# Patient Record
Sex: Male | Born: 1990
Health system: Southern US, Community
[De-identification: ages and names within clinical notes are randomized; demographics above are authoritative.]

## PROBLEM LIST (undated history)

## (undated) DIAGNOSIS — M1990 Unspecified osteoarthritis, unspecified site: Secondary | ICD-10-CM

---
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
## 2015-10-10 MED FILL — CELECOXIB 200 MG CAPSULE: 200 | 30 days supply | Qty: 30 | Fill #0

## 2015-12-23 MED FILL — CELECOXIB 200 MG CAPSULE: 200 | 30 days supply | Qty: 30 | Fill #1

## 2016-01-17 ENCOUNTER — Emergency Department (HOSPITAL_COMMUNITY)
Admission: EM | Admit: 2016-01-17 | Discharge: 2016-01-18 | Disposition: A | Discharge status: Home / Self Care | Payer: 59 | Attending: Emergency Medicine | Admitting: Emergency Medicine

## 2016-01-17 ENCOUNTER — Emergency Department (HOSPITAL_COMMUNITY): Payer: 59

## 2016-01-17 DIAGNOSIS — R509 Fever, unspecified: Secondary | ICD-10-CM | POA: Diagnosis present

## 2016-01-17 DIAGNOSIS — J189 Pneumonia, unspecified organism: Secondary | ICD-10-CM

## 2016-01-17 LAB — URINALYSIS, ROUTINE W REFLEX MICROSCOPIC
Bilirubin Urine: NEGATIVE
GLUCOSE, UA: NEGATIVE mg/dL
Hgb urine dipstick: NEGATIVE
KETONES UR: NEGATIVE mg/dL
LEUKOCYTES UA: NEGATIVE
Nitrite: NEGATIVE
PH: 5.5 (ref 5.0–8.0)
Protein, ur: NEGATIVE mg/dL
SPECIFIC GRAVITY, URINE: 1.01 (ref 1.005–1.030)

## 2016-01-17 LAB — CBC WITH DIFFERENTIAL/PLATELET
BASOS ABS: 0 10*3/uL (ref 0.0–0.1)
Basophils Relative: 0 %
Eosinophils Absolute: 0 10*3/uL (ref 0.0–0.7)
Eosinophils Relative: 0 %
HEMATOCRIT: 46.6 % (ref 39.0–52.0)
HEMOGLOBIN: 16.4 g/dL (ref 13.0–17.0)
LYMPHS PCT: 16 %
Lymphs Abs: 2.1 10*3/uL (ref 0.7–4.0)
MCH: 31.5 pg (ref 26.0–34.0)
MCHC: 35.2 g/dL (ref 30.0–36.0)
MCV: 89.4 fL (ref 78.0–100.0)
MONO ABS: 1 10*3/uL (ref 0.1–1.0)
MONOS PCT: 8 %
NEUTROS ABS: 9.9 10*3/uL — AB (ref 1.7–7.7)
NEUTROS PCT: 76 %
Platelets: 244 10*3/uL (ref 150–400)
RBC: 5.21 MIL/uL (ref 4.22–5.81)
RDW: 12.2 % (ref 11.5–15.5)
WBC: 13 10*3/uL — ABNORMAL HIGH (ref 4.0–10.5)

## 2016-01-17 LAB — COMPREHENSIVE METABOLIC PANEL
ALBUMIN: 4.3 g/dL (ref 3.5–5.0)
ALK PHOS: 62 U/L (ref 38–126)
ALT: 44 U/L (ref 17–63)
AST: 46 U/L — AB (ref 15–41)
Anion gap: 11 (ref 5–15)
BILIRUBIN TOTAL: 0.6 mg/dL (ref 0.3–1.2)
BUN: 10 mg/dL (ref 6–20)
CALCIUM: 9.6 mg/dL (ref 8.9–10.3)
CO2: 22 mmol/L (ref 22–32)
Chloride: 102 mmol/L (ref 101–111)
Creatinine, Ser: 1 mg/dL (ref 0.61–1.24)
GFR calc Af Amer: >60 mL/min (ref >60)
GFR calc non Af Amer: >60 mL/min (ref >60)
GLUCOSE: 99 mg/dL (ref 65–99)
Potassium: 3.8 mmol/L (ref 3.5–5.1)
Sodium: 135 mmol/L (ref 135–145)
TOTAL PROTEIN: 7.7 g/dL (ref 6.5–8.1)

## 2016-01-17 LAB — I-STAT BETA HCG BLOOD, ED (MC, WL, AP ONLY): I-stat hCG, quantitative: <5 m[IU]/mL (ref <5)

## 2016-01-17 LAB — I-STAT CG4 LACTIC ACID, ED: Lactic Acid, Venous: 2.13 mmol/L (ref 0.5–1.9)

## 2016-01-17 LAB — RAPID STREP SCREEN (MED CTR MEBANE ONLY): Streptococcus, Group A Screen (Direct): NEGATIVE

## 2016-01-17 MED ORDER — AZITHROMYCIN 250 MG PO TABS: 250.0000 mg | ORAL_TABLET | Freq: Every day | ORAL | 0 refills | Status: DC

## 2016-01-17 MED ORDER — DEXTROSE 5 % IV SOLN
500.0000 mg | Freq: Once | INTRAVENOUS | Status: AC
  Administered 2016-01-17: 500 mg via INTRAVENOUS
  Filled 2016-01-17: qty 500

## 2016-01-17 MED ORDER — IBUPROFEN 600 MG PO TABS: 600.0000 mg | ORAL_TABLET | Freq: Four times a day (QID) | ORAL | 0 refills | Status: AC | PRN

## 2016-01-17 MED ORDER — OSELTAMIVIR PHOSPHATE 75 MG PO CAPS: 75.0000 mg | ORAL_CAPSULE | Freq: Two times a day (BID) | ORAL | 0 refills | Status: AC

## 2016-01-17 MED ORDER — DEXTROSE 5 % IV SOLN
1.0000 g | Freq: Once | INTRAVENOUS | Status: AC
  Administered 2016-01-17: 1 g via INTRAVENOUS
  Filled 2016-01-17: qty 10

## 2016-01-17 MED ORDER — SODIUM CHLORIDE 0.9 % IV BOLUS (SEPSIS)
2000.0000 mL | Freq: Once | INTRAVENOUS | Status: AC
  Administered 2016-01-17: 2000 mL via INTRAVENOUS

## 2016-01-17 MED ORDER — ACETAMINOPHEN 500 MG PO TABS
1000.0000 mg | ORAL_TABLET | Freq: Once | ORAL | Status: AC
  Administered 2016-01-17: 1000 mg via ORAL
  Filled 2016-01-17: qty 2

## 2016-01-17 MED ORDER — KETOROLAC TROMETHAMINE 30 MG/ML IJ SOLN
30.0000 mg | Freq: Once | INTRAMUSCULAR | Status: AC
  Administered 2016-01-17: 30 mg via INTRAVENOUS
  Filled 2016-01-17: qty 1

## 2016-01-17 NOTE — ED Provider Notes (Signed)
[facility]-EMERGENCY DEPT Provider Note   CSN: [id number] Arrival date & time: 01/17/16  2057     History   Chief Complaint Chief Complaint  Patient presents with  . Fever    HPI Marcus Skinner is a 25 y.o. male.  HPI Patient presents with one day of fever, shaking chills, myalgia, headache, nonproductive cough. Denies vomiting or diarrhea, urinary symptoms, neck pain or stiffness. No new rashes. Has mild low back pain. Denies focal weakness or numbness. No known sick contacts. No recent international travel. No known tick or insect bites. Patient did not receive flu shot this year. Has been taking TheraFlu at home No past medical history on file.  There are no active problems to display for this patient.   No past surgical history on file.     Home Medications    Prior to Admission medications   Medication Sig Start Date End Date Taking? Authorizing Provider  celecoxib (CELEBREX) 200 MG capsule Take 200 mg by mouth daily.   Yes Historical Provider, MD  Pseudoephedrine-APAP-DM (DAYQUIL PO) Take 1 capsule by mouth daily as needed.   Yes Historical Provider, MD  azithromycin (ZITHROMAX) 250 MG tablet Take 1 tablet (250 mg total) by mouth daily. 01/18/16   Loren Raceravid Avanna Sowder, MD  ibuprofen (ADVIL,MOTRIN) 600 MG tablet Take 1 tablet (600 mg total) by mouth every 6 (six) hours as needed for fever, headache or moderate pain. 01/17/16   Loren Raceravid Mark Hassey, MD  oseltamivir (TAMIFLU) 75 MG capsule Take 1 capsule (75 mg total) by mouth every 12 (twelve) hours. 01/17/16   Loren Raceravid Shavanna Furnari, MD    Family History No family history on file.  Social History Social History  Substance Use Topics  . Smoking status: Not on file  . Smokeless tobacco: Not on file  . Alcohol use Not on file     Allergies   Review of patient's allergies indicates no known allergies.   Review of Systems Review of Systems  Constitutional: Positive for chills and fever.  HENT: Negative for congestion, sinus  pressure and sore throat.   Eyes: Negative for visual disturbance.  Respiratory: Positive for cough. Negative for shortness of breath and wheezing.   Cardiovascular: Negative for chest pain, palpitations and leg swelling.  Gastrointestinal: Positive for abdominal pain. Negative for diarrhea, nausea and vomiting.  Genitourinary: Negative for dysuria, flank pain, frequency and hematuria.  Musculoskeletal: Positive for back pain and myalgias. Negative for arthralgias, joint swelling, neck pain and neck stiffness.  Skin: Negative for rash and wound.  Neurological: Positive for headaches. Negative for dizziness, syncope, weakness, light-headedness and numbness.  All other systems reviewed and are negative.    Physical Exam Updated Vital Signs BP 129/88   Pulse 102   Temp 98.9 F (37.2 C) (Oral)   Resp 22   Ht 5\' 7"  (1.702 m)   Wt 181 lb (82.1 kg)   SpO2 97%   BMI 28.35 kg/m   Physical Exam  Constitutional: He is oriented to person, place, and time. He appears well-developed and well-nourished. No distress.  HENT:  Head: Normocephalic and atraumatic.  Mouth/Throat: Oropharynx is clear and moist.  Bilateral tonsillar hypertrophy with erythema. No exudates present. No sinus tenderness with percussion.  Eyes: EOM are normal. Pupils are equal, round, and reactive to light.  Neck: Normal range of motion. Neck supple.  No meningismus  Cardiovascular: Regular rhythm.  Exam reveals no gallop and no friction rub.   No murmur heard. Tachycardia  Pulmonary/Chest: Effort normal. He has wheezes.  Few scattered wheezes and rhonchi.  Abdominal: Soft. Bowel sounds are normal. There is no tenderness. There is no rebound and no guarding.  Musculoskeletal: Normal range of motion. He exhibits no edema or tenderness.  No midline thoracic or lumbar tenderness. No CVA tenderness. No lower extremity swelling or asymmetry.  Lymphadenopathy:    He has no cervical adenopathy.  Neurological: He is alert  and oriented to person, place, and time.  Moves all extremities without deficit. Sensation is fully intact.  Skin: Skin is warm and dry. Capillary refill takes less than 2 seconds. No rash noted. No erythema.  Psychiatric: He has a normal mood and affect. His behavior is normal.  Nursing note and vitals reviewed.    ED Treatments / Results  Labs (all labs ordered are listed, but only abnormal results are displayed) Labs Reviewed  COMPREHENSIVE METABOLIC PANEL - Abnormal; Notable for the following:       Result Value   AST 46 (*)    All other components within normal limits  CBC WITH DIFFERENTIAL/PLATELET - Abnormal; Notable for the following:    WBC 13.0 (*)    Neutro Abs 9.9 (*)    All other components within normal limits  I-STAT CG4 LACTIC ACID, ED - Abnormal; Notable for the following:    Lactic Acid, Venous 2.13 (*)    All other components within normal limits  RAPID STREP SCREEN (NOT AT [facility])  CULTURE, BLOOD (ROUTINE X 2)  CULTURE, BLOOD (ROUTINE X 2)  URINE CULTURE  CULTURE, GROUP A STREP ([facility])  URINALYSIS, ROUTINE W REFLEX MICROSCOPIC (NOT AT [facility])  INFLUENZA PANEL BY PCR (TYPE A & B, H1N1)  I-STAT BETA HCG BLOOD, ED ([facility], [facility], [facility] ONLY)    EKG  EKG Interpretation None       Radiology Dg Chest 2 View  Result Date: 01/17/2016 CLINICAL DATA:  Fever, headache, nausea. EXAM: CHEST  2 VIEW COMPARISON:  None. FINDINGS: Cardiomediastinal silhouette is normal. Patchy retrocardiac airspace opacity. Mild bronchitic changes. No pleural effusion. Trachea projects midline and there is no pneumothorax. Soft tissue planes and included osseous structures are non-suspicious. IMPRESSION: Bronchitic changes and retrocardiac airspace opacity concerning for bronchopneumonia. Electronically Signed   By: Awilda Metro M.D.   On: 01/17/2016 22:03    Procedures Procedures (including critical care time)  Medications Ordered in ED Medications  cefTRIAXone (ROCEPHIN) 1 g in dextrose  5 % 50 mL IVPB (1 g Intravenous New Bag/Given 01/17/16 2248)  azithromycin (ZITHROMAX) 500 mg in dextrose 5 % 250 mL IVPB (500 mg Intravenous New Bag/Given 01/17/16 2249)  sodium chloride 0.9 % bolus 2,000 mL (2,000 mLs Intravenous New Bag/Given 01/17/16 2148)  ketorolac (TORADOL) 30 MG/ML injection 30 mg (30 mg Intravenous Given 01/17/16 2148)  acetaminophen (TYLENOL) tablet 1,000 mg (1,000 mg Oral Given 01/17/16 2145)     Initial Impression / Assessment and Plan / ED Course  I have reviewed the triage vital signs and the nursing notes.  Pertinent labs & imaging results that were available during my care of the patient were reviewed by me and considered in my medical decision making (see chart for details).  Clinical Course   Patient is very well-appearing. He is in no distress. Repeat vital signs and improvement of tachycardia and patient is now normotensive. Continues to be very well-appearing. Given rapid onset, concern for influenza as cause for the patient's pneumonia. Will add Tamiflu to treatment course. Advised to follow-up with the patient's primary physician and return precautions given.  Final  Clinical Impressions(s) / ED Diagnoses   Final diagnoses:  Community acquired pneumonia, unspecified laterality    New Prescriptions New Prescriptions   AZITHROMYCIN (ZITHROMAX) 250 MG TABLET    Take 1 tablet (250 mg total) by mouth daily.   IBUPROFEN (ADVIL,MOTRIN) 600 MG TABLET    Take 1 tablet (600 mg total) by mouth every 6 (six) hours as needed for fever, headache or moderate pain.   OSELTAMIVIR (TAMIFLU) 75 MG CAPSULE    Take 1 capsule (75 mg total) by mouth every 12 (twelve) hours.     Loren Racer, MD 01/17/16 [phone]

## 2016-01-17 NOTE — ED Triage Notes (Signed)
Patient arrives with fever of unknown origin. Early this morning fever began. Up to 104.5. Also endorses head ache, right lower back ache, mild nausea without vomiting. Denies cough, abdominal pain, urinary symptoms. States that he felt fine yesterday.

## 2016-01-18 LAB — INFLUENZA PANEL BY PCR (TYPE A & B)
H1N1FLUPCR: NOT DETECTED
INFLAPCR: NEGATIVE
Influenza B By PCR: NEGATIVE

## 2016-01-18 NOTE — ED Notes (Signed)
Pt verbalized understanding of d/c instructions and has no further questions. Pt stable and NAD. VSS.  

## 2016-01-19 LAB — URINE CULTURE

## 2016-01-20 LAB — CULTURE, GROUP A STREP (THRC)

## 2016-01-22 LAB — CULTURE, BLOOD (ROUTINE X 2)
CULTURE: NO GROWTH
Culture: NO GROWTH

## 2016-03-26 ENCOUNTER — Encounter (HOSPITAL_COMMUNITY): Payer: Self-pay

## 2016-03-26 ENCOUNTER — Emergency Department (HOSPITAL_COMMUNITY): Payer: 59

## 2016-03-26 DIAGNOSIS — R079 Chest pain, unspecified: Secondary | ICD-10-CM | POA: Diagnosis present

## 2016-03-26 DIAGNOSIS — J189 Pneumonia, unspecified organism: Secondary | ICD-10-CM | POA: Insufficient documentation

## 2016-03-26 DIAGNOSIS — Z79899 Other long term (current) drug therapy: Secondary | ICD-10-CM | POA: Diagnosis not present

## 2016-03-26 LAB — CBC
HEMATOCRIT: 41.2 % (ref 39.0–52.0)
Hemoglobin: 13.6 g/dL (ref 13.0–17.0)
MCH: 30 pg (ref 26.0–34.0)
MCHC: 33 g/dL (ref 30.0–36.0)
MCV: 90.7 fL (ref 78.0–100.0)
Platelets: 297 10*3/uL (ref 150–400)
RBC: 4.54 MIL/uL (ref 4.22–5.81)
RDW: 13.1 % (ref 11.5–15.5)
WBC: 16.9 10*3/uL — ABNORMAL HIGH (ref 4.0–10.5)

## 2016-03-26 LAB — I-STAT TROPONIN, ED: Troponin i, poc: 0.06 ng/mL (ref 0.00–0.08)

## 2016-03-26 NOTE — ED Triage Notes (Signed)
Pt complaining of fevers x 1 week. Pt states new onset sharp left sided chest pain ~30 mins ago. Pt denies any injury/trauma. Pt states dry cough.

## 2016-03-27 ENCOUNTER — Emergency Department (HOSPITAL_COMMUNITY): Payer: 59

## 2016-03-27 ENCOUNTER — Emergency Department (HOSPITAL_COMMUNITY)
Admission: EM | Admit: 2016-03-27 | Discharge: 2016-03-27 | Disposition: A | Discharge status: Home / Self Care | Payer: 59 | Attending: Emergency Medicine | Admitting: Emergency Medicine

## 2016-03-27 DIAGNOSIS — J189 Pneumonia, unspecified organism: Secondary | ICD-10-CM

## 2016-03-27 DIAGNOSIS — J181 Lobar pneumonia, unspecified organism: Secondary | ICD-10-CM

## 2016-03-27 LAB — BASIC METABOLIC PANEL
ANION GAP: 12 (ref 5–15)
BUN: 13 mg/dL (ref 6–20)
CHLORIDE: 102 mmol/L (ref 101–111)
CO2: 23 mmol/L (ref 22–32)
CREATININE: 0.85 mg/dL (ref 0.61–1.24)
Calcium: 9.6 mg/dL (ref 8.9–10.3)
GFR calc non Af Amer: >60 mL/min (ref >60)
Glucose, Bld: 102 mg/dL — ABNORMAL HIGH (ref 65–99)
POTASSIUM: 3.6 mmol/L (ref 3.5–5.1)
SODIUM: 137 mmol/L (ref 135–145)

## 2016-03-27 LAB — I-STAT TROPONIN, ED: Troponin i, poc: 0 ng/mL (ref 0.00–0.08)

## 2016-03-27 MED ORDER — AZITHROMYCIN 250 MG PO TABS
500.0000 mg | ORAL_TABLET | Freq: Once | ORAL | Status: AC
  Administered 2016-03-27: 500 mg via ORAL
  Filled 2016-03-27: qty 2

## 2016-03-27 MED ORDER — HYDROCOD POLST-CPM POLST ER 10-8 MG/5ML PO SUER: 5.0000 mL | Freq: Two times a day (BID) | ORAL | 0 refills | Status: AC | PRN

## 2016-03-27 MED ORDER — AZITHROMYCIN 250 MG PO TABS: ORAL_TABLET | ORAL | 0 refills | Status: DC

## 2016-03-27 MED ORDER — DEXTROSE 5 % IV SOLN
1.0000 g | Freq: Once | INTRAVENOUS | Status: AC
  Administered 2016-03-27: 1 g via INTRAVENOUS
  Filled 2016-03-27: qty 10

## 2016-03-27 MED ORDER — IOPAMIDOL (ISOVUE-370) INJECTION 76%
INTRAVENOUS | Status: AC
  Administered 2016-03-27: 100 mL
  Filled 2016-03-27: qty 100

## 2016-03-27 NOTE — ED Provider Notes (Signed)
[facility]EM[facility] DEPT Provider Note   CSN: [id number] Arrival date & time: 03/26/16  2251     History   Chief Complaint Chief Complaint  Patient presents with  . Chest Pain  . Fever    HPI Marcus Skinner is a 25 y.o. male.  Patient presents with left sided chest pain x 1 day described as sharp, radiating to back, associated with deep breath, movement or cough. He reports he has had daily fever to Tmax 102 for the past 10 days with cough. No nausea or vomiting. No significant congestion. He had pneumonia in October of this year with similar symptoms. He reports a history of arthritis, formerly on Humira - stopped 8 months ago - now on Celebrex only. Patient is a non-smoker.   The history is provided by the patient. No language interpreter was used.  Chest Pain   Associated symptoms include cough, a fever and shortness of breath. Pertinent negatives include no abdominal pain, no nausea and no vomiting.  Fever   Associated symptoms include chest pain and cough. Pertinent negatives include no vomiting and no congestion.    No past medical history on file.  There are no active problems to display for this patient.   No past surgical history on file.     Home Medications    Prior to Admission medications   Medication Sig Start Date End Date Taking? Authorizing Provider  azithromycin (ZITHROMAX) 250 MG tablet Take 1 tablet (250 mg total) by mouth daily. 01/18/16   Loren Racer, MD  celecoxib (CELEBREX) 200 MG capsule Take 200 mg by mouth daily.    Historical Provider, MD  ibuprofen (ADVIL,MOTRIN) 600 MG tablet Take 1 tablet (600 mg total) by mouth every 6 (six) hours as needed for fever, headache or moderate pain. 01/17/16   Loren Racer, MD  oseltamivir (TAMIFLU) 75 MG capsule Take 1 capsule (75 mg total) by mouth every 12 (twelve) hours. 01/17/16   Loren Racer, MD  Pseudoephedrine-APAP-DM (DAYQUIL PO) Take 1 capsule by mouth daily as needed.    Historical  Provider, MD    Family History No family history on file.  Social History Social History  Substance Use Topics  . Smoking status: Never Smoker  . Smokeless tobacco: Never Used  . Alcohol use No     Allergies   Patient has no known allergies.   Review of Systems Review of Systems  Constitutional: Positive for fever.  HENT: Negative.  Negative for congestion.   Respiratory: Positive for cough and shortness of breath.   Cardiovascular: Positive for chest pain.  Gastrointestinal: Negative.  Negative for abdominal pain, nausea and vomiting.  Musculoskeletal: Negative.  Negative for myalgias.  Skin: Negative.   Neurological: Negative.      Physical Exam Updated Vital Signs BP 125/85 (BP Location: Right Arm)   Pulse 82   Temp 98.1 F (36.7 C) (Oral)   Resp 18   SpO2 99%   Physical Exam  Constitutional: He is oriented to person, place, and time. He appears well-developed and well-nourished. No distress.  HENT:  Head: Normocephalic.  Neck: Normal range of motion. Neck supple.  Cardiovascular: Normal rate and regular rhythm.   No murmur heard. Pulmonary/Chest: Effort normal and breath sounds normal. He has no wheezes. He has no rales.  Abdominal: Soft. There is no tenderness.  Musculoskeletal: Normal range of motion.  Neurological: He is alert and oriented to person, place, and time.  Skin: Skin is warm and dry.     ED  Treatments / Results  Labs (all labs ordered are listed, but only abnormal results are displayed) Labs Reviewed  BASIC METABOLIC PANEL - Abnormal; Notable for the following:       Result Value   Glucose, Bld 102 (*)    All other components within normal limits  CBC - Abnormal; Notable for the following:    WBC 16.9 (*)    All other components within normal limits  I-STAT TROPOININ, ED   Results for orders placed or performed during the hospital encounter of 03/27/16  Basic metabolic panel  Result Value Ref Range   Sodium 137 135 - 145  mmol/L   Potassium 3.6 3.5 - 5.1 mmol/L   Chloride 102 101 - 111 mmol/L   CO2 23 22 - 32 mmol/L   Glucose, Bld 102 (H) 65 - 99 mg/dL   BUN 13 6 - 20 mg/dL   Creatinine, Ser 1.610.85 0.61 - 1.24 mg/dL   Calcium 9.6 8.9 - 09.610.3 mg/dL   GFR calc non Af Amer >60 >60 mL/min   GFR calc Af Amer >60 >60 mL/min   Anion gap 12 5 - 15  CBC  Result Value Ref Range   WBC 16.9 (H) 4.0 - 10.5 K/uL   RBC 4.54 4.22 - 5.81 MIL/uL   Hemoglobin 13.6 13.0 - 17.0 g/dL   HCT 04.541.2 40.939.0 - 81.152.0 %   MCV 90.7 78.0 - 100.0 fL   MCH 30.0 26.0 - 34.0 pg   MCHC 33.0 30.0 - 36.0 g/dL   RDW 91.413.1 78.211.5 - 95.615.5 %   Platelets 297 150 - 400 K/uL  I-stat troponin, ED  Result Value Ref Range   Troponin i, poc 0.06 0.00 - 0.08 ng/mL   Comment 3            EKG  EKG Interpretation  Date/Time:  Friday March 26 2016 22:56:21 EST Ventricular Rate:  105 PR Interval:  120 QRS Duration: 78 QT Interval:  314 QTC Calculation: 415 R Axis:   24 Text Interpretation:  Sinus tachycardia Possible Left atrial enlargement Borderline ECG No old tracing to compare Confirmed by FLOYD MD, DANIEL [phone]([id number]) on 03/27/2016 1:44:44 AM       Radiology Dg Chest 2 View  Result Date: 03/27/2016 CLINICAL DATA:  Acute onset of left-sided chest pain, fever and cough. Initial encounter. EXAM: CHEST  2 VIEW COMPARISON:  Chest radiograph performed 01/17/2016 FINDINGS: The lungs are well-aerated. New left midlung airspace opacification is compatible with pneumonia. There is no evidence of pleural effusion or pneumothorax. The heart is normal in size; the mediastinal contour is within normal limits. No acute osseous abnormalities are seen. IMPRESSION: New left midlung pneumonia noted. Electronically Signed   By: Roanna RaiderJeffery  Chang M.D.   On: 03/27/2016 00:29    Procedures Procedures (including critical care time)  Medications Ordered in ED Medications  cefTRIAXone (ROCEPHIN) 1 g in dextrose 5 % 50 mL IVPB (not administered)     Initial Impression  / Assessment and Plan / ED Course  I have reviewed the triage vital signs and the nursing notes.  Pertinent labs & imaging results that were available during my care of the patient were reviewed by me and considered in my medical decision making (see chart for details).  Clinical Course     Patient with symptoms of cough and fever for the past week. He denies significant SOB - some on exertion that does not affect activity. Similar symptoms with pneumonia in October of this year.  Chest x-ray shows a recurrent pneumonia. VSS, no hypoxia. He has leukocytosis but appears in NAD and non-toxic. Initial troponin 0.06, however repeat test 0.00. CT chest performed and verifies pneumonia without other concerning finding.   Discussed with Dr. Adela LankFloyd who feels he is stable for discharge home.   Final Clinical Impressions(s) / ED Diagnoses   Final diagnoses:  None   1. CAP  New Prescriptions New Prescriptions   No medications on file     Elpidio AnisShari Malyk Girouard, PA-C 03/30/16 2213    Melene Planan Floyd, DO 04/01/16 [phone]

## 2016-07-16 DIAGNOSIS — M456 Ankylosing spondylitis lumbar region: Secondary | ICD-10-CM | POA: Diagnosis not present

## 2016-07-16 DIAGNOSIS — M549 Dorsalgia, unspecified: Secondary | ICD-10-CM | POA: Diagnosis not present

## 2016-07-16 DIAGNOSIS — Z79899 Other long term (current) drug therapy: Secondary | ICD-10-CM | POA: Diagnosis not present

## 2016-07-16 DIAGNOSIS — M546 Pain in thoracic spine: Secondary | ICD-10-CM | POA: Diagnosis not present

## 2016-07-16 MED FILL — MELOXICAM 15 MG TABLET: 15 | 30 days supply | Qty: 30 | Fill #0

## 2016-07-27 ENCOUNTER — Other Ambulatory Visit: Payer: Self-pay | Admitting: Rheumatology

## 2016-07-27 DIAGNOSIS — M459 Ankylosing spondylitis of unspecified sites in spine: Secondary | ICD-10-CM

## 2016-07-28 DIAGNOSIS — M456 Ankylosing spondylitis lumbar region: Secondary | ICD-10-CM | POA: Diagnosis not present

## 2016-08-05 ENCOUNTER — Other Ambulatory Visit: Payer: 59

## 2016-10-15 DIAGNOSIS — M549 Dorsalgia, unspecified: Secondary | ICD-10-CM | POA: Diagnosis not present

## 2016-10-15 DIAGNOSIS — Z79899 Other long term (current) drug therapy: Secondary | ICD-10-CM | POA: Diagnosis not present

## 2016-10-15 DIAGNOSIS — M456 Ankylosing spondylitis lumbar region: Secondary | ICD-10-CM | POA: Diagnosis not present

## 2017-01-28 DIAGNOSIS — M459 Ankylosing spondylitis of unspecified sites in spine: Secondary | ICD-10-CM | POA: Diagnosis not present

## 2017-02-03 DIAGNOSIS — J020 Streptococcal pharyngitis: Secondary | ICD-10-CM | POA: Diagnosis not present

## 2017-05-17 DIAGNOSIS — Z79899 Other long term (current) drug therapy: Secondary | ICD-10-CM | POA: Diagnosis not present

## 2017-05-17 DIAGNOSIS — M456 Ankylosing spondylitis lumbar region: Secondary | ICD-10-CM | POA: Diagnosis not present

## 2017-05-17 DIAGNOSIS — M549 Dorsalgia, unspecified: Secondary | ICD-10-CM | POA: Diagnosis not present

## 2017-05-26 DIAGNOSIS — J029 Acute pharyngitis, unspecified: Secondary | ICD-10-CM | POA: Diagnosis not present

## 2017-05-26 DIAGNOSIS — R21 Rash and other nonspecific skin eruption: Secondary | ICD-10-CM | POA: Diagnosis not present

## 2017-06-02 DIAGNOSIS — R21 Rash and other nonspecific skin eruption: Secondary | ICD-10-CM | POA: Diagnosis not present

## 2017-06-30 DIAGNOSIS — L404 Guttate psoriasis: Secondary | ICD-10-CM | POA: Diagnosis not present

## 2017-07-28 DIAGNOSIS — L409 Psoriasis, unspecified: Secondary | ICD-10-CM | POA: Diagnosis not present

## 2017-09-14 DIAGNOSIS — J029 Acute pharyngitis, unspecified: Secondary | ICD-10-CM | POA: Diagnosis not present

## 2017-11-10 ENCOUNTER — Emergency Department (HOSPITAL_COMMUNITY): Payer: 59

## 2017-11-10 ENCOUNTER — Emergency Department (HOSPITAL_COMMUNITY)
Admission: EM | Admit: 2017-11-10 | Discharge: 2017-11-10 | Disposition: A | Discharge status: Home / Self Care | Payer: 59 | Attending: Emergency Medicine | Admitting: Emergency Medicine

## 2017-11-10 ENCOUNTER — Encounter (HOSPITAL_COMMUNITY): Payer: Self-pay | Admitting: Emergency Medicine

## 2017-11-10 DIAGNOSIS — R05 Cough: Secondary | ICD-10-CM | POA: Diagnosis not present

## 2017-11-10 DIAGNOSIS — R0789 Other chest pain: Secondary | ICD-10-CM | POA: Diagnosis not present

## 2017-11-10 DIAGNOSIS — R079 Chest pain, unspecified: Secondary | ICD-10-CM | POA: Diagnosis not present

## 2017-11-10 DIAGNOSIS — Z79899 Other long term (current) drug therapy: Secondary | ICD-10-CM | POA: Diagnosis not present

## 2017-11-10 DIAGNOSIS — J029 Acute pharyngitis, unspecified: Secondary | ICD-10-CM | POA: Diagnosis not present

## 2017-11-10 DIAGNOSIS — J189 Pneumonia, unspecified organism: Secondary | ICD-10-CM | POA: Diagnosis not present

## 2017-11-10 DIAGNOSIS — J020 Streptococcal pharyngitis: Secondary | ICD-10-CM | POA: Insufficient documentation

## 2017-11-10 DIAGNOSIS — R509 Fever, unspecified: Secondary | ICD-10-CM | POA: Diagnosis present

## 2017-11-10 HISTORY — DX: Unspecified osteoarthritis, unspecified site: M19.90

## 2017-11-10 LAB — CBC WITH DIFFERENTIAL/PLATELET
ABS IMMATURE GRANULOCYTES: 0.1 10*3/uL (ref 0.0–0.1)
BASOS PCT: 0 %
Basophils Absolute: 0.1 10*3/uL (ref 0.0–0.1)
Eosinophils Absolute: 0 10*3/uL (ref 0.0–0.7)
Eosinophils Relative: 0 %
HEMATOCRIT: 47.4 % (ref 39.0–52.0)
HEMOGLOBIN: 15.6 g/dL (ref 13.0–17.0)
Immature Granulocytes: 1 %
LYMPHS PCT: 10 %
Lymphs Abs: 1.6 10*3/uL (ref 0.7–4.0)
MCH: 30.4 pg (ref 26.0–34.0)
MCHC: 32.9 g/dL (ref 30.0–36.0)
MCV: 92.4 fL (ref 78.0–100.0)
MONO ABS: 0.9 10*3/uL (ref 0.1–1.0)
MONOS PCT: 6 %
NEUTROS ABS: 13 10*3/uL — AB (ref 1.7–7.7)
Neutrophils Relative %: 83 %
Platelets: 239 10*3/uL (ref 150–400)
RBC: 5.13 MIL/uL (ref 4.22–5.81)
RDW: 12.5 % (ref 11.5–15.5)
WBC: 15.7 10*3/uL — ABNORMAL HIGH (ref 4.0–10.5)

## 2017-11-10 LAB — COMPREHENSIVE METABOLIC PANEL
ALBUMIN: 3.9 g/dL (ref 3.5–5.0)
ALK PHOS: 56 U/L (ref 38–126)
ALT: 57 U/L — ABNORMAL HIGH (ref 0–44)
ANION GAP: 11 (ref 5–15)
AST: 34 U/L (ref 15–41)
BUN: 10 mg/dL (ref 6–20)
CALCIUM: 9.3 mg/dL (ref 8.9–10.3)
CO2: 21 mmol/L — AB (ref 22–32)
Chloride: 104 mmol/L (ref 98–111)
Creatinine, Ser: 1.09 mg/dL (ref 0.61–1.24)
GFR calc Af Amer: >60 mL/min (ref >60)
GFR calc non Af Amer: >60 mL/min (ref >60)
GLUCOSE: 107 mg/dL — AB (ref 70–99)
Potassium: 3.6 mmol/L (ref 3.5–5.1)
SODIUM: 136 mmol/L (ref 135–145)
Total Bilirubin: 0.8 mg/dL (ref 0.3–1.2)
Total Protein: 7.2 g/dL (ref 6.5–8.1)

## 2017-11-10 LAB — I-STAT CG4 LACTIC ACID, ED: Lactic Acid, Venous: 0.84 mmol/L (ref 0.5–1.9)

## 2017-11-10 LAB — GROUP A STREP BY PCR: Group A Strep by PCR: DETECTED — AB

## 2017-11-10 MED ORDER — AZITHROMYCIN 250 MG PO TABS: 250.0000 mg | ORAL_TABLET | Freq: Every day | ORAL | 0 refills | Status: AC

## 2017-11-10 MED ORDER — SODIUM CHLORIDE 0.9 % IV BOLUS
1000.0000 mL | Freq: Once | INTRAVENOUS | Status: AC
  Administered 2017-11-10: 1000 mL via INTRAVENOUS

## 2017-11-10 MED ORDER — AZITHROMYCIN 250 MG PO TABS
500.0000 mg | ORAL_TABLET | Freq: Once | ORAL | Status: AC
  Administered 2017-11-10: 500 mg via ORAL
  Filled 2017-11-10: qty 2

## 2017-11-10 MED ORDER — PENICILLIN G BENZATHINE 1200000 UNIT/2ML IM SUSP
1.2000 10*6.[IU] | Freq: Once | INTRAMUSCULAR | Status: AC
  Administered 2017-11-10: 1.2 10*6.[IU] via INTRAMUSCULAR
  Filled 2017-11-10: qty 2

## 2017-11-10 NOTE — ED Provider Notes (Signed)
[facility] Provider Note  CSN: [id number] Arrival date & time: 11/10/17  1345  History   Chief Complaint Chief Complaint  Patient presents with  . Fever    HPI Marcus Skinner is a 27 y.o. male with no significant medical history who presented to the ED for several upper respiratory symptoms x1 day. Endorses fever, sore throat, painful swallowing, cough and shortness of breath. Denies chest pain, palpitations, leg swelling, headache, neck pain, trismus or voice changes. Endorses recent sick contact with his child who had strep throat. Patient has tried nothing prior to coming to the ED.  Past Medical History:  Diagnosis Date  . Arthritis     There are no active problems to display for this patient.   History reviewed. No pertinent surgical history.      Home Medications    Prior to Admission medications   Medication Sig Start Date End Date Taking? Authorizing Provider  azithromycin (ZITHROMAX) 250 MG tablet Take 1 tablet (250 mg total) by mouth daily for 4 days. 11/10/17 11/14/17  Damany Eastman, Jerrel IvoryGabrielle I, PA-C  celecoxib (CELEBREX) 200 MG capsule Take 200 mg by mouth daily.    [provider]  chlorpheniramine-HYDROcodone (TUSSIONEX PENNKINETIC ER) 10-8 MG/5ML SUER Take 5 mLs by mouth every 12 (twelve) hours as needed for cough. 03/27/16   Elpidio AnisUpstill, Shari, PA-C  ibuprofen (ADVIL,MOTRIN) 200 MG tablet Take 200 mg by mouth every 6 (six) hours as needed for fever.    [provider]  ibuprofen (ADVIL,MOTRIN) 600 MG tablet Take 1 tablet (600 mg total) by mouth every 6 (six) hours as needed for fever, headache or moderate pain. Patient not taking: Reported on 03/27/2016 01/17/16   Loren RacerYelverton, David, MD  oseltamivir (TAMIFLU) 75 MG capsule Take 1 capsule (75 mg total) by mouth every 12 (twelve) hours. Patient not taking: Reported on 03/27/2016 01/17/16   Loren RacerYelverton, David, MD    Family History No family history on file.  Social  History Social History   Tobacco Use  . Smoking status: Never Smoker  . Smokeless tobacco: Never Used  Substance Use Topics  . Alcohol use: No  . Drug use: Not on file     Allergies   Patient has no known allergies.   Review of Systems Review of Systems  Constitutional: Positive for fatigue and fever. Negative for chills.  HENT: Positive for congestion, sore throat and trouble swallowing. Negative for drooling, ear pain, facial swelling, sinus pressure, sinus pain and voice change.   Eyes: Negative for pain and visual disturbance.  Respiratory: Positive for cough, chest tightness and shortness of breath.   Cardiovascular: Negative for chest pain, palpitations and leg swelling.  Gastrointestinal: Negative.   Genitourinary: Negative.   Musculoskeletal: Negative.   Skin: Negative.   Neurological: Negative for dizziness, light-headedness and headaches.     Physical Exam Updated Vital Signs BP 115/85   Pulse (!) 113   Temp 98.1 F (36.7 C) (Oral)   Resp 16   SpO2 98%   Physical Exam  Constitutional: He appears well-developed and well-nourished. He is cooperative.  Non-toxic appearance. He appears ill.  Patient laying in bed sleeping. He appears uncomfortable when awaken.  HENT:  Head: Normocephalic and atraumatic.  Right Ear: Tympanic membrane, external ear and ear canal normal.  Left Ear: Tympanic membrane, external ear and ear canal normal.  Nose: Nose normal.  Mouth/Throat: Uvula is midline and mucous membranes are normal. No trismus in the jaw. No uvula swelling. No tonsillar  abscesses. Tonsils are 3+ on the right. Tonsils are 3+ on the left. Tonsillar exudate.  Bilateral tonsillar edema with erythema. Exudate seen on left tonsil.  Eyes: Pupils are equal, round, and reactive to light. Conjunctivae, EOM and lids are normal.  Cardiovascular: Regular rhythm, normal heart sounds, intact distal pulses and normal pulses. Tachycardia present.  No murmur  heard. Pulmonary/Chest: Effort normal and breath sounds normal. No tachypnea. No respiratory distress.  Lymphadenopathy:    He has no cervical adenopathy.  Neurological: He is alert.  Skin: No rash noted. He is not diaphoretic. No pallor.  Nursing note and vitals reviewed.    ED Treatments / Results  Labs (all labs ordered are listed, but only abnormal results are displayed) Labs Reviewed  GROUP A STREP BY PCR - Abnormal; Notable for the following components:      Result Value   Group A Strep by PCR DETECTED (*)    All other components within normal limits  COMPREHENSIVE METABOLIC PANEL - Abnormal; Notable for the following components:   CO2 21 (*)    Glucose, Bld 107 (*)    ALT 57 (*)    All other components within normal limits  CBC WITH DIFFERENTIAL/PLATELET - Abnormal; Notable for the following components:   WBC 15.7 (*)    Neutro Abs 13.0 (*)    All other components within normal limits  URINALYSIS, ROUTINE W REFLEX MICROSCOPIC  I-STAT CG4 LACTIC ACID, ED    EKG None  Radiology Dg Chest 2 View  Result Date: 11/10/2017 CLINICAL DATA:  Chest pain with cough EXAM: CHEST - 2 VIEW COMPARISON:  None. FINDINGS: There is lingular airspace disease concerning for pneumonia. There is no pleural effusion or pneumothorax. The heart and mediastinal contours are unremarkable. The osseous structures are unremarkable. IMPRESSION: Lingular airspace disease concerning for pneumonia. Followup PA and lateral chest X-ray is recommended in 3-4 weeks following trial of antibiotic therapy to ensure resolution and exclude underlying malignancy. Electronically Signed   By: Elige KoHetal  Patel   On: 11/10/2017 14:54    Procedures Procedures (including critical care time)  Medications Ordered in ED Medications  sodium chloride 0.9 % bolus 1,000 mL (1,000 mLs Intravenous New Bag/Given 11/10/17 1810)  penicillin g benzathine (BICILLIN LA) 1200000 UNIT/2ML injection 1.2 Million Units (has no  administration in time range)  azithromycin (ZITHROMAX) tablet 500 mg (500 mg Oral Given 11/10/17 1743)     Initial Impression / Assessment and Plan / ED Course  Triage vital signs and the nursing notes have been reviewed.  Pertinent labs & imaging results that were available during care of the patient were reviewed and considered in medical decision making (see chart for details).  Patient presents afebrile, but reports subjective fevers and cough since yesterday. Symptoms and physical exam suggestive of PNA. On exam, patient is not in respiratory distress and has been maintaining oxygen saturations > 98%. Patient has no risk factors such as CF, EtOH use or recent hospitalizations that raises concerns for unusual PNA pathogens.  He also has erythematous and edematous tonsils which is concerning for strep pharyngitis given recent exposure. Fortunately, his respiratory state is normal and stable. There is no neck pain, trismus, stridor or other abnormal findings that raise concern for retropharyngeal abscess.   Clinical Course as of Nov 10 1820  Thu Nov 10, 2017  1734 Elevated WBC indicative of infection. Normal lactic acid which is useful to rule out sepsis. Remaining labs unremarkable. EKG shows sinus tachycardia. No ST elevations/depressions or signs  of acute ischemia/infarct. CXR significant for consolidation/airspace disease seen in the lingular space. Useful in confirming PNA diagnosis. Patient remains hemodynamically and respiratorily stable to receive PO antibiotics as outpatient.   [GM]  1754 Tachycardic upon arrival. Will administer 1L NS bolus.   [GM]  1811 Strep +. Will treat with PCN G 1.10M units x1 today.   [GM]    Clinical Course User Index [GM] Marcus Skinner, Sharyon Medicus, PA-C   Final Clinical Impressions(s) / ED Diagnoses  1. Community Acquired Pneumonia. Zithromax 500mg  x1 given today. Prescribed Zithromax 250mg  QD x4 days for treatment. Advised to follow-up with PCP after  antibiotics is completed if he continues to have symptoms. Education provided on OTC and supportive treatment for symptom relief. 2. Strep Pharyngitis. Treated with IM Penicillin G 1.10M U  x1 today. Education provided on sick exposures and OTC/supportive treatment for symptom relief.  Dispo: Home. After thorough clinical evaluation, this patient is determined to be medically stable and can be safely discharged with the previously mentioned treatment and/or outpatient follow-up/referral(s). At this time, there are no other apparent medical conditions that require further screening, evaluation or treatment.  Final diagnoses:  Community acquired pneumonia, unspecified laterality  Strep pharyngitis    ED Discharge Orders         Ordered    azithromycin (ZITHROMAX) 250 MG tablet  Daily     11/10/17 [address] 11/10/17 Merri Brunette, MD 11/10/17 2242

## 2017-11-10 NOTE — Discharge Instructions (Addendum)
I'm sorry you had to come here on your child's birthday! You have strep throat and pneumonia.  You have received an injection of penicillin today to treat the strep throat. You do not need any more antibiotics for this.  You also received your 1st dose of antibiotics (Zithromax) for pneumonia. Starting tomorrow, you will continue taking Azithromycin (Zithromax) for 4 days. Be sure to take the full course of this medication to ensure your medication is treated appropriately.  For fever, you may continue take Tylenol and/or Ibuprofen. Be sure to drink plenty of water.  I hope you begin to feel better soon!

## 2017-11-10 NOTE — ED Triage Notes (Signed)
Patient complains of chest pain with cough and fever since yesterday. Patient took tylenol prior to coming to ED. Patient alert, oriented, and in no apparent distress at this time.

## 2017-11-14 DIAGNOSIS — Z79899 Other long term (current) drug therapy: Secondary | ICD-10-CM | POA: Diagnosis not present

## 2017-11-14 DIAGNOSIS — M456 Ankylosing spondylitis lumbar region: Secondary | ICD-10-CM | POA: Diagnosis not present

## 2017-11-14 DIAGNOSIS — J020 Streptococcal pharyngitis: Secondary | ICD-10-CM | POA: Diagnosis not present

## 2017-11-17 DIAGNOSIS — J0301 Acute recurrent streptococcal tonsillitis: Secondary | ICD-10-CM | POA: Diagnosis not present

## 2017-11-17 DIAGNOSIS — J189 Pneumonia, unspecified organism: Secondary | ICD-10-CM | POA: Diagnosis not present

## 2018-01-08 DIAGNOSIS — B950 Streptococcus, group A, as the cause of diseases classified elsewhere: Secondary | ICD-10-CM | POA: Diagnosis not present

## 2018-01-08 DIAGNOSIS — J029 Acute pharyngitis, unspecified: Secondary | ICD-10-CM | POA: Diagnosis not present

## 2018-01-24 DIAGNOSIS — J029 Acute pharyngitis, unspecified: Secondary | ICD-10-CM | POA: Diagnosis not present

## 2018-01-24 DIAGNOSIS — B950 Streptococcus, group A, as the cause of diseases classified elsewhere: Secondary | ICD-10-CM | POA: Diagnosis not present

## 2018-01-24 DIAGNOSIS — Z7689 Persons encountering health services in other specified circumstances: Secondary | ICD-10-CM | POA: Diagnosis not present

## 2018-01-24 DIAGNOSIS — R509 Fever, unspecified: Secondary | ICD-10-CM | POA: Diagnosis not present

## 2018-03-04 IMAGING — CR DG CHEST 2V
2 series · 2 of 2 positions shown · non-contrast
Comparison: Chest radiograph performed 01/17/2016

CLINICAL DATA: Acute onset of left-sided chest pain, fever and
cough. Initial encounter.

EXAM:
CHEST  2 VIEW

[chest pa]
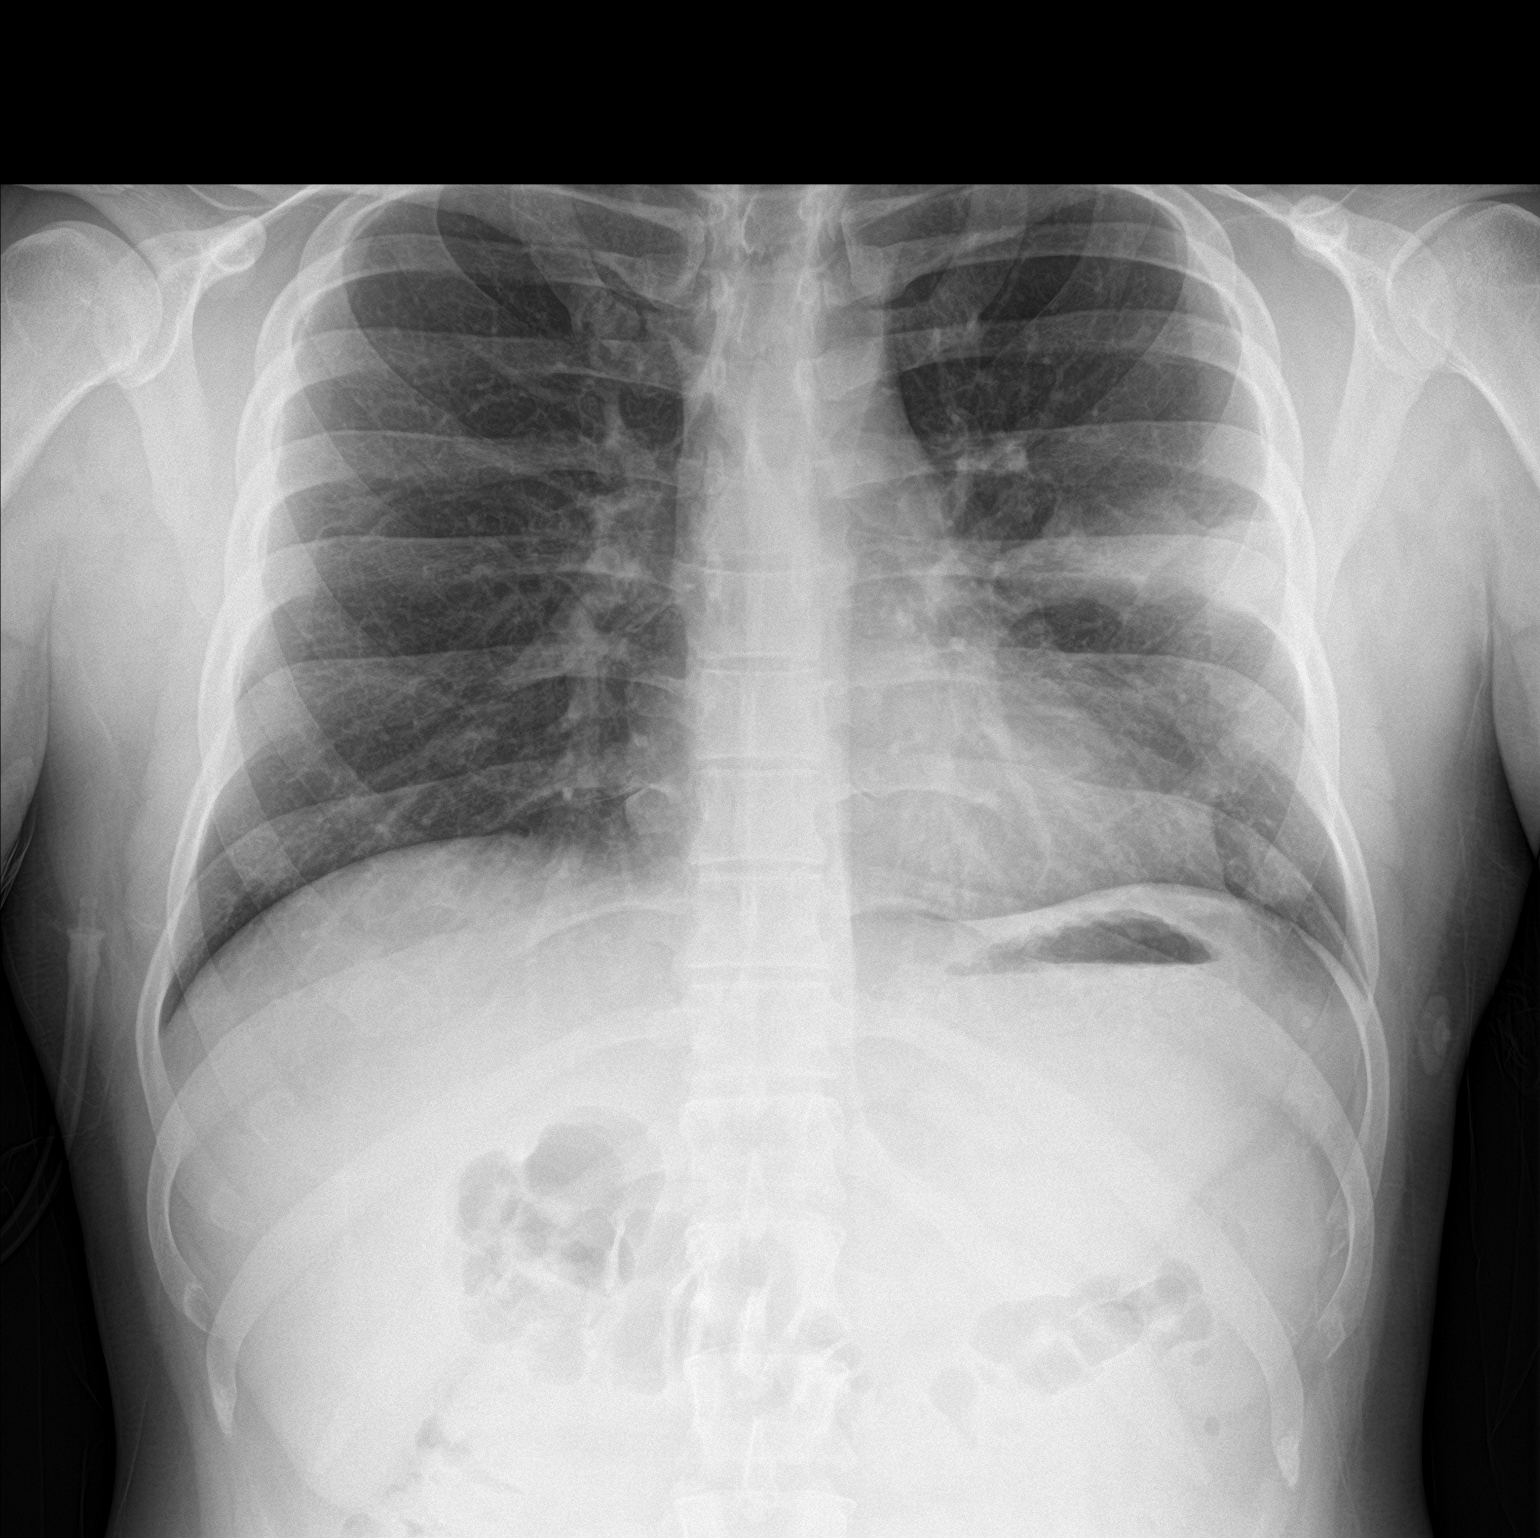

[chest lat]
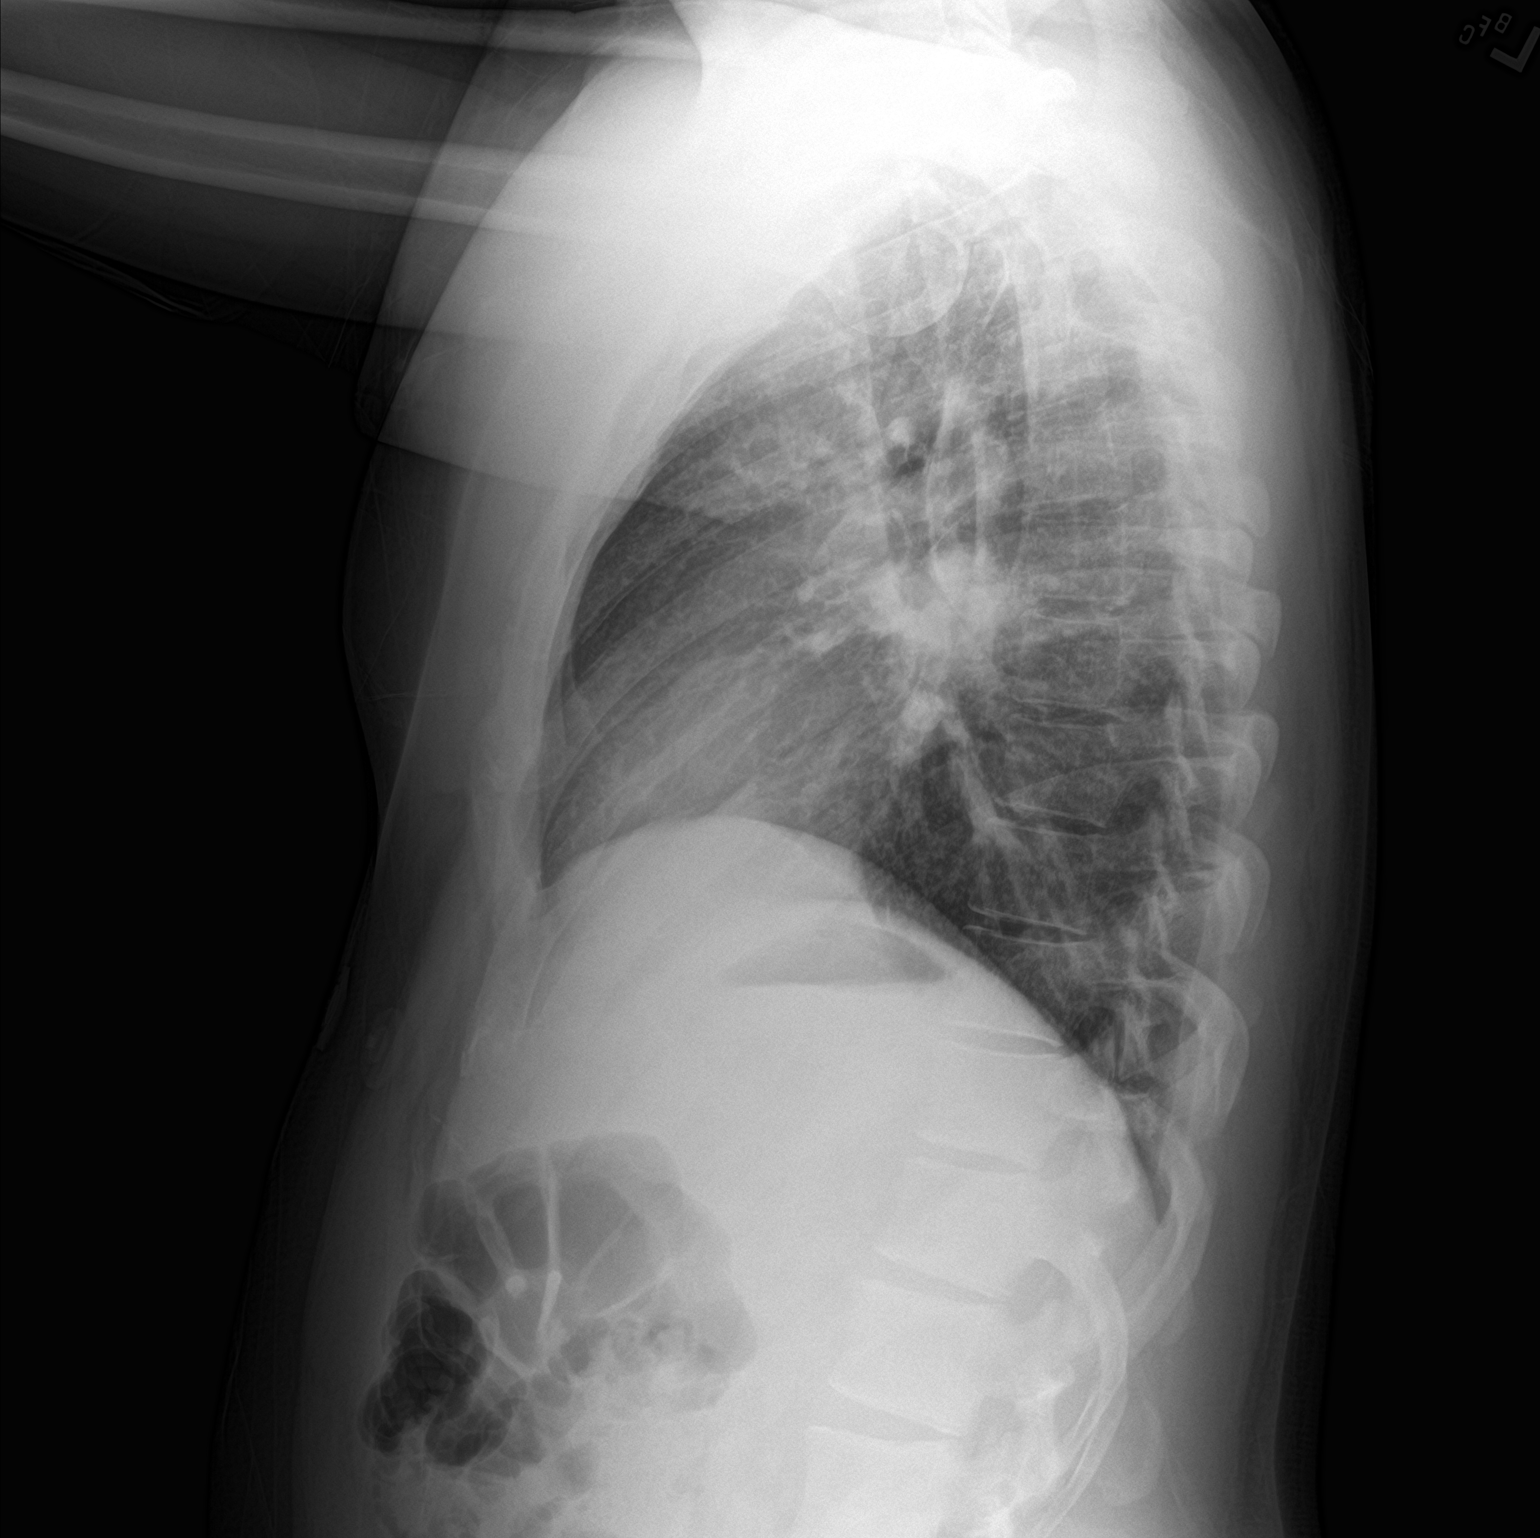

[2 of 2 positions shown; findings below may reference images not displayed]

FINDINGS: The lungs are well-aerated. New left midlung airspace opacification
is compatible with pneumonia. There is no evidence of pleural
effusion or pneumothorax.

The heart is normal in size; the mediastinal contour is within
normal limits. No acute osseous abnormalities are seen.
IMPRESSION: New left midlung pneumonia noted.

## 2019-10-19 IMAGING — DX DG CHEST 2V
2 series · 2 of 2 positions shown · non-contrast
Comparison: None.

CLINICAL DATA: Chest pain with cough

EXAM:
CHEST - 2 VIEW

[chest pa]
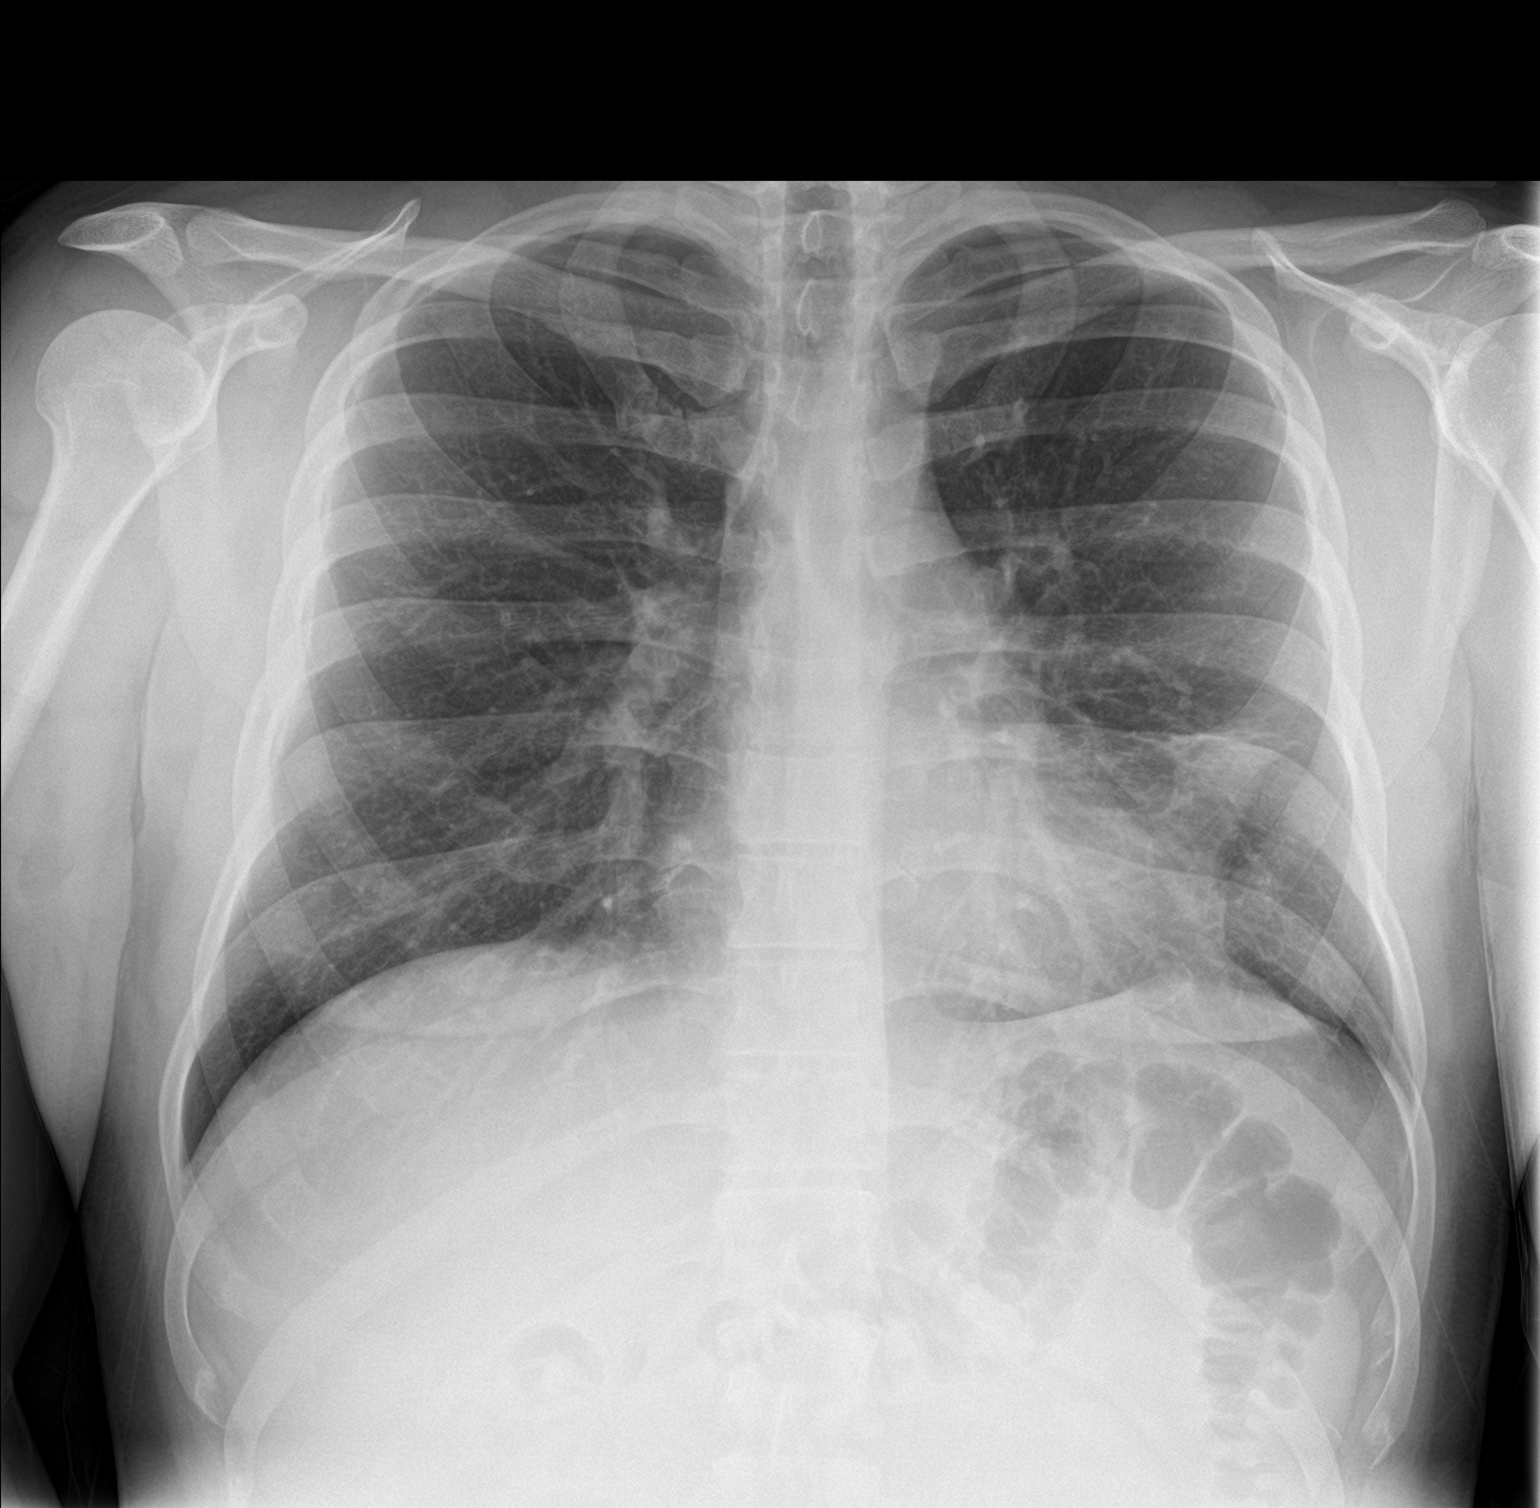

[chest lat]
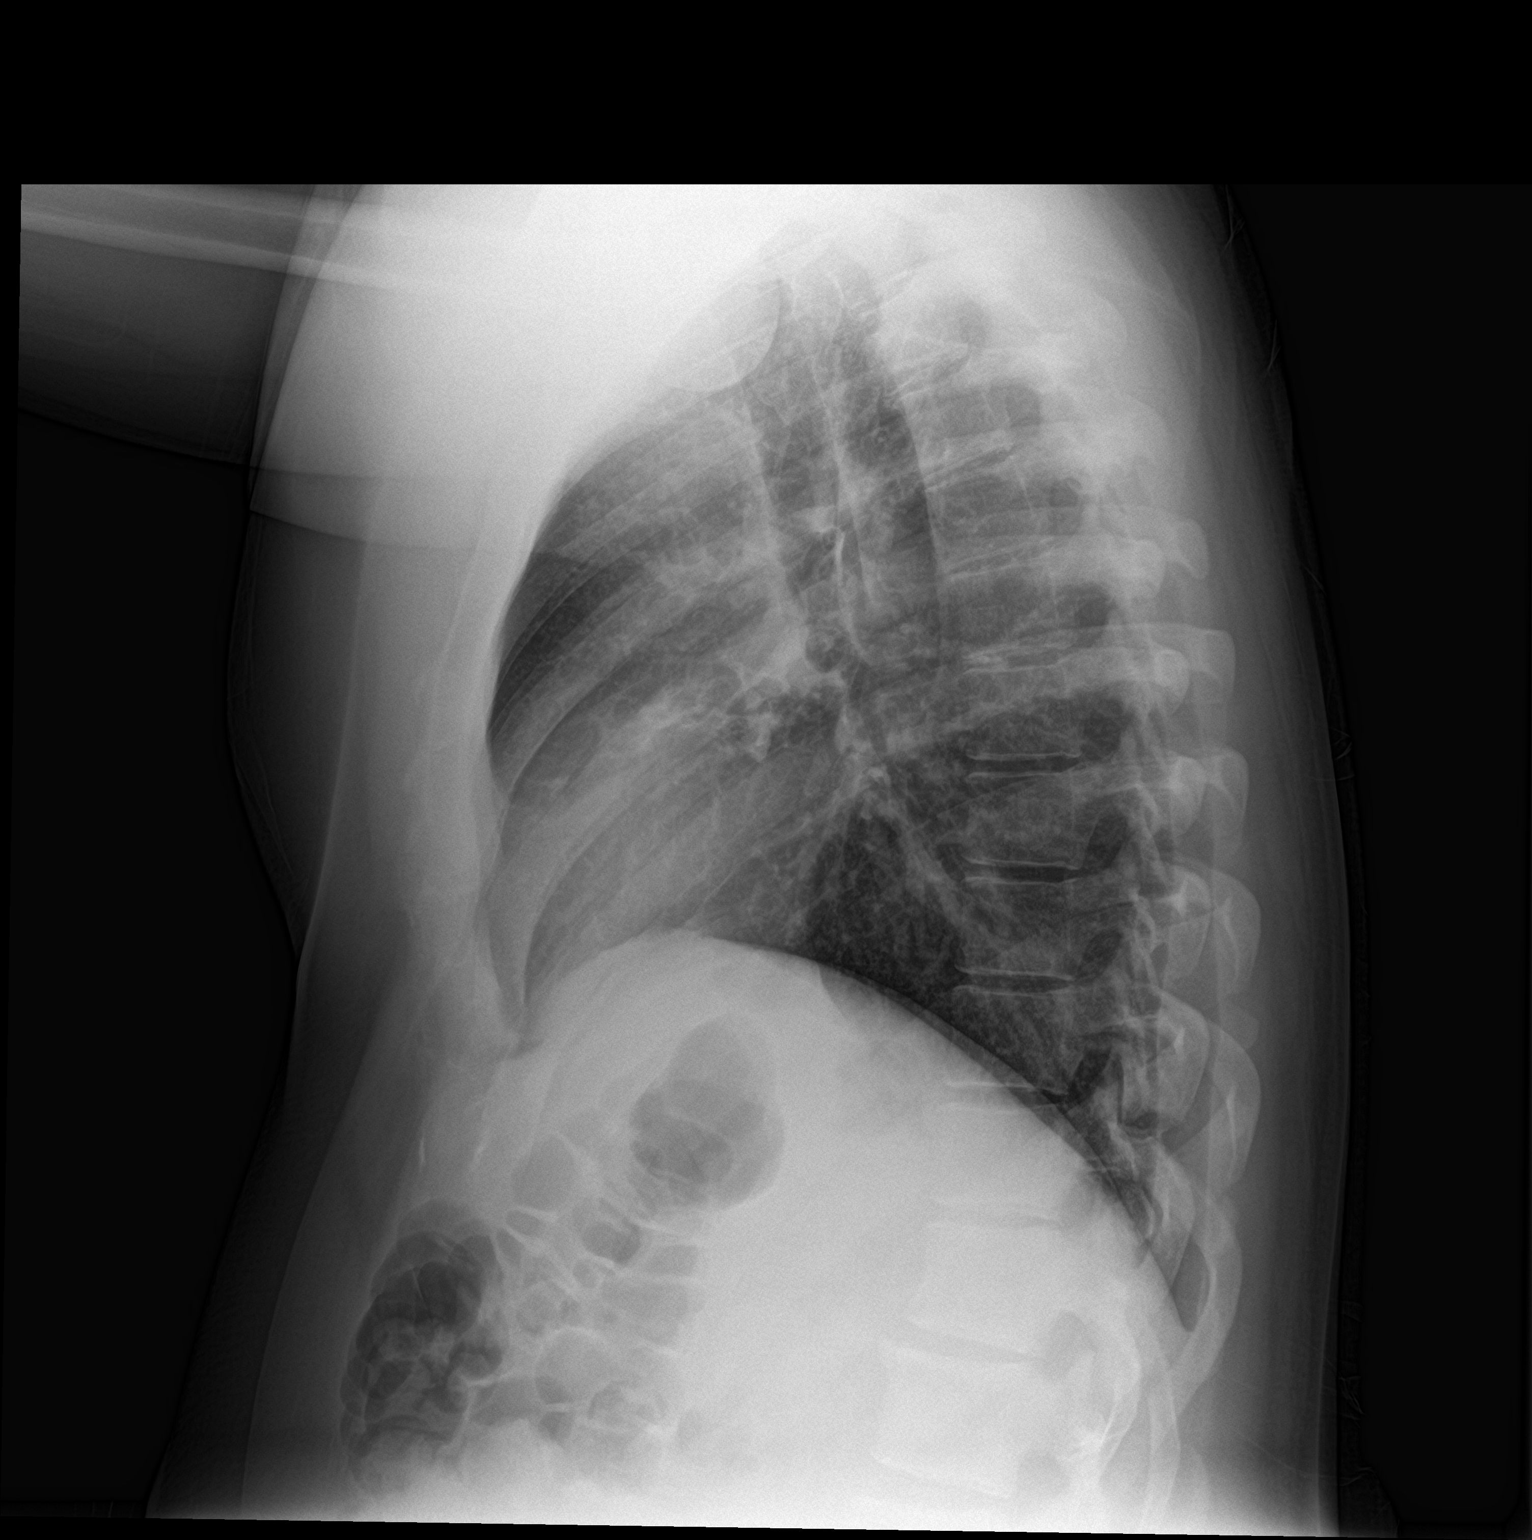

[2 of 2 positions shown; findings below may reference images not displayed]

FINDINGS: There is lingular airspace disease concerning for pneumonia. There
is no pleural effusion or pneumothorax. The heart and mediastinal
contours are unremarkable.

The osseous structures are unremarkable.
IMPRESSION: Lingular airspace disease concerning for pneumonia. Followup PA and
lateral chest X-ray is recommended in 3-4 weeks following trial of
antibiotic therapy to ensure resolution and exclude underlying
malignancy.
